# Patient Record
Sex: Female | Born: 2000 | Race: White | Hispanic: No | Marital: Single | Smoking: Never smoker
Health system: Southern US, Community
[De-identification: ages and names within clinical notes are randomized; demographics above are authoritative.]

## PROBLEM LIST (undated history)

## (undated) HISTORY — PX: SPINE SURGERY: SHX786

## (undated) HISTORY — PX: OTHER SURGICAL HISTORY: SHX169

---
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
## 2022-02-03 ENCOUNTER — Ambulatory Visit
Admission: EM | Admit: 2022-02-03 | Discharge: 2022-02-03 | Disposition: A | Discharge status: Home / Self Care | Payer: BC Managed Care – PPO | Attending: Emergency Medicine | Admitting: Emergency Medicine

## 2022-02-03 ENCOUNTER — Encounter: Payer: Self-pay | Admitting: Emergency Medicine

## 2022-02-03 DIAGNOSIS — J029 Acute pharyngitis, unspecified: Secondary | ICD-10-CM | POA: Insufficient documentation

## 2022-02-03 LAB — POCT RAPID STREP A (OFFICE): Rapid Strep A Screen: NEGATIVE

## 2022-02-03 MED ORDER — IBUPROFEN 600 MG PO TABS: 600.0000 mg | ORAL_TABLET | Freq: Four times a day (QID) | ORAL | 0 refills | Status: DC | PRN

## 2022-02-03 MED ORDER — FAMOTIDINE 20 MG PO TABS: 20.0000 mg | ORAL_TABLET | Freq: Two times a day (BID) | ORAL | 0 refills | Status: DC

## 2022-02-03 NOTE — Discharge Instructions (Addendum)
your rapid strep was negative today, so we have sent off a throat culture.  We will contact you and call in the appropriate antibiotics if your culture comes back positive for an infection requiring antibiotic treatment.  Give [place] a working phone number.  1 gram of Tylenol and 600 mg ibuprofen together 3-4 times a day as needed for pain.  Make sure you drink plenty of extra fluids.  Some people find salt water gargles and  Traditional Medicinal's "Throat Coat" tea helpful. Take 5 mL of liquid Benadryl and 5 mL of Maalox. Mix it together, and then hold it in your mouth for as long as you can and then swallow. You may do this 4 times a day.  Also try the Pepcid for a week.  If your sore throat resolves with this, then we know that it was due to acid reflux.  Go to www.goodrx.com  or www.costplusdrugs.com to look up your medications. This will give you a list of where you can find your prescriptions at the most affordable prices. Or ask the pharmacist what the cash price is, or if they have any other discount programs available to help make your medication more affordable. This can be less expensive than what you would pay with insurance.

## 2022-02-03 NOTE — ED Triage Notes (Signed)
Pt presents with ST x 2 days.  °

## 2022-02-03 NOTE — ED Provider Notes (Signed)
HPI  SUBJECTIVE:  Patient reports sore throat starting 2 to 3 days ago. Sx worse with yelling.  Sx better with Advil 400 mg twice daily.  Has also tried Chloraseptic spray without relief. No fever   No neck stiffness  + "Some" cough No nasal congestion, rhinorrhea + Postnasal drip No Myalgias No Headache No Rash  No loss of taste or smell No shortness of breath or difficulty breathing + nausea for day, has since resolved,  No vomiting No diarrhea No abdominal pain     No Recent Strep, mono, COVID exposure She got 2 doses of the COVID-vaccine. + reflux sxs-is reporting "heartburn". No allergy sxs  No Breathing difficulty, voice changes, sensation of throat swelling shut No Drooling No Trismus No abx in past month.  + Antipyretic in the past 6 hours-ibuprofen She is status post spinal fusion.  No history of allergies, GERD LMP: 2 weeks ago.  Denies the possibility of being pregnant. PCP: [facility].   History reviewed. No pertinent past medical history.  Past Surgical History:  Procedure Laterality Date   spinal fusion     July 2016 and November 2016    Family History  Problem Relation Age of Onset   Asthma Brother    Hyperlipidemia Maternal Grandmother    Arthritis Maternal Grandfather    Hyperlipidemia Maternal Grandfather    Arthritis Paternal Grandmother    Hyperlipidemia Paternal Grandmother    Arthritis Paternal Grandfather    Hypertension Paternal Grandfather    Hyperlipidemia Paternal Grandfather     Social History   Tobacco Use   Smoking status: Never   Smokeless tobacco: Never  Vaping Use   Vaping Use: Never used  Substance Use Topics   Alcohol use: Never   Drug use: Never    No current facility-administered medications for this encounter.  Current Outpatient Medications:    famotidine (PEPCID) 20 MG tablet, Take 1 tablet (20 mg total) by mouth 2 (two) times daily for 7 days., Disp: 14 tablet, Rfl: 0   ibuprofen (ADVIL) 600 MG  tablet, Take 1 tablet (600 mg total) by mouth every 6 (six) hours as needed., Disp: 30 tablet, Rfl: 0   cetirizine (ZYRTEC) 10 MG tablet, Take 10 mg by mouth as needed for allergies., Disp: , Rfl:   No Known Allergies   ROS  As noted in HPI.   Physical Exam  BP 116/70 (BP Location: Left Arm)   Pulse 84   Temp 98.3 F (36.8 C) (Oral)   Resp 18   LMP  (LMP Unknown)   SpO2 98%   Constitutional: Well developed, well nourished, no acute distress Eyes:  EOMI, conjunctiva normal bilaterally HENT: Normocephalic, atraumatic,mucus membranes moist.  Nasal congestion.  Normal tonsils without exudates.  Normal oropharynx.  Uvula midline.  Positive postnasal drip, cobblestoning Respiratory: Normal inspiratory effort Cardiovascular: Normal rate, no murmurs, rubs, gallops GI: nondistended, nontender. No appreciable splenomegaly skin: No rash, skin intact Lymph: Positive anterior cervical LN.  No posterior cervical lymphadenopathy Musculoskeletal: no deformities Neurologic: Alert & oriented x 3, no focal neuro deficits Psychiatric: Speech and behavior appropriate.   ED Course   Medications - No data to display  Orders Placed This Encounter  Procedures   Culture, group A strep    Standing Status:   Standing    Number of Occurrences:   1   POCT rapid strep A    Standing Status:   Standing    Number of Occurrences:   1  Results for orders placed or performed during the hospital encounter of 02/03/22 (from the past 24 hour(s))  POCT rapid strep A     Status: None   Collection Time: 02/03/22  9:46 AM  Result Value Ref Range   Rapid Strep A Screen Negative Negative   No results found.  ED Clinical Impression  1. Sore throat      ED Assessment/Plan  Rapid strep negative. Obtaining throat culture to guide antibiotic treatment because of the lymphadenopathy.. Discussed this with patient. We'll contact her if culture is positive, and will call in Appropriate antibiotics. Patient  home with ibuprofen, Tylenol, Benadryl/Maalox mixture, will try a week of Pepcid as she is reporting GERD symptoms. Patient to followup with PCP when necessary.  Discussed labs,  MDM, plan and followup with patient. Discussed sn/sx that should prompt return to the ED. patient agrees with plan.   Meds ordered this encounter  Medications   ibuprofen (ADVIL) 600 MG tablet    Sig: Take 1 tablet (600 mg total) by mouth every 6 (six) hours as needed.    Dispense:  30 tablet    Refill:  0   famotidine (PEPCID) 20 MG tablet    Sig: Take 1 tablet (20 mg total) by mouth 2 (two) times daily for 7 days.    Dispense:  14 tablet    Refill:  0     *This clinic note was created using Scientist, clinical (histocompatibility and immunogenetics). Therefore, there may be occasional mistakes despite careful proofreading.     Domenick Gong, MD 02/03/22 1427

## 2022-02-10 ENCOUNTER — Ambulatory Visit: Payer: BC Managed Care – PPO | Admitting: Internal Medicine

## 2022-02-10 ENCOUNTER — Encounter: Payer: Self-pay | Admitting: Internal Medicine

## 2022-02-10 VITALS — BP 116/72 | HR 71 | Temp 98.4°F | Ht 59.0 in | Wt 132.6 lb

## 2022-02-10 DIAGNOSIS — J028 Acute pharyngitis due to other specified organisms: Secondary | ICD-10-CM | POA: Diagnosis not present

## 2022-02-10 DIAGNOSIS — J029 Acute pharyngitis, unspecified: Secondary | ICD-10-CM | POA: Insufficient documentation

## 2022-02-10 LAB — MONONUCLEOSIS SCREEN: Mono Screen: NEGATIVE

## 2022-02-10 NOTE — Assessment & Plan Note (Addendum)
Symptoms have been unchanged for the past week, unaccompanied by sinus drainage  and accompanied by mild fatigue and persistent cervical LAD.  Negative test for IM/ Mono.  Awaiting results of throat culture.   Empiric azithromycin prescribed

## 2022-02-10 NOTE — Patient Instructions (Signed)
The throat culture done by Urgent Care has not resulted yet  I'm testing you for "Mono" today  I want you to try taking Benadryl at night ONLY to see if it helps (post nasal drip can cause sore throat)

## 2022-02-10 NOTE — Progress Notes (Addendum)
Subjective:  Patient ID: Chelsea Randolph, female    DOB: [birth year]  Age: 21 y.o. MRN: [id number]  CC: The primary encounter diagnosis was Pharyngitis due to other organism. A diagnosis of Pharyngitis, unspecified etiology was also pertinent to this visit.   HPI Chelsea Randolph presents for  Chief Complaint  Patient presents with   Sore Throat    One week history of sore throat ,  started after coming back from the beach.  Was tested last week for strep throat and the POC was negative.  Culture still pending.  Treated for symptomatic GERD which resolved after a few days of pepcid  Throat was sore before the GERD and still sore.   No history of mOno. ,  slight headache.  Some cervical LAD>     Outpatient Medications Prior to Visit  Medication Sig Dispense Refill   cetirizine (ZYRTEC) 10 MG tablet Take 10 mg by mouth as needed for allergies.     famotidine (PEPCID) 20 MG tablet Take 1 tablet (20 mg total) by mouth 2 (two) times daily for 7 days. 14 tablet 0   ibuprofen (ADVIL) 600 MG tablet Take 1 tablet (600 mg total) by mouth every 6 (six) hours as needed. 30 tablet 0   No facility-administered medications prior to visit.    Review of Systems;  Patient denies severe headache, fevers, malaise, unintentional weight loss, skin rash, eye pain, sinus congestion and sinus pain, sore throat, dysphagia,  hemoptysis , cough, dyspnea, wheezing, chest pain, palpitations, orthopnea, edema, abdominal pain, nausea, melena, diarrhea, constipation, flank pain, dysuria, hematuria, urinary  Frequency, nocturia, numbness, tingling, seizures,  Focal weakness, Loss of consciousness,  Tremor, insomnia, depression, anxiety, and suicidal ideation.      Objective:  BP 116/72 (BP Location: Left Arm, Patient Position: Sitting, Cuff Size: Normal)   Pulse 71   Temp 98.4 F (36.9 C) (Oral)   Ht 4\' 11"  (1.499 m)   Wt 132 lb 9.6 oz (60.1 kg)   LMP 01/26/2022   SpO2 99%   BMI 26.78 kg/m   BP  Readings from Last 3 Encounters:  02/10/22 116/72  02/03/22 116/70  03/25/21 (!) 98/52 (17 %, Z = -0.95 /  14 %, Z = -1.08)*   *BP percentiles are based on the 2017 AAP Clinical Practice Guideline for girls    Wt Readings from Last 3 Encounters:  02/10/22 132 lb 9.6 oz (60.1 kg) (63 %, Z= 0.32)*  03/25/21 128 lb (58.1 kg) (59 %, Z= 0.22)*   * Growth percentiles are based on CDC (Girls, 2-20 Years) data.    General appearance: alert, cooperative and appears stated age Ears: TMs occluded by Cerumen s Throat: lips, mucosa, and tongue normal; teeth and gums normal. Tonsillar erythema bilaterally without ulceration Neck: tender cervical LAD, no carotid bruit, supple, symmetrical, trachea midline and thyroid not enlarged, symmetric, no tenderness/mass/nodules Back: symmetric, no curvature. ROM normal. No CVA tenderness. Lungs: clear to auscultation bilaterally Heart: regular rate and rhythm, S1, S2 normal, no murmur, click, rub or gallop Abdomen: soft, non-tender; bowel sounds normal; no masses,  no organomegaly Pulses: 2+ and symmetric Skin: Skin color, texture, turgor normal. No rashes or lesions Lymph nodes: Cervical, supraclavicular, and axillary nodes normal.  No results found for: "HGBA1C"  No results found for: "CREATININE"  No results found for: "WBC", "HGB", "HCT", "PLT", "GLUCOSE", "CHOL", "TRIG", "HDL", "LDLDIRECT", "LDLCALC", "ALT", "AST", "NA", "K", "CL", "CREATININE", "BUN", "CO2", "TSH", "PSA", "INR", "GLUF", "HGBA1C", "MICROALBUR"  No results found.  Assessment & Plan:   Problem List Items Addressed This Visit     Pharyngitis - Primary    Symptoms have been unchanged for the past week, unaccompanied by sinus drainage  and accompanied by mild fatigue and persistent cervical LAD.  Negative test for IM/ Mono.  Awaiting results of throat culture.   Empiric azithromycin prescribed       Relevant Orders   Monospot (Completed)    Follow-up: No follow-ups on  file.   Crecencio Mc, MD

## 2022-02-11 MED ORDER — AZITHROMYCIN 500 MG PO TABS: 500.0000 mg | ORAL_TABLET | Freq: Every day | ORAL | 0 refills | Status: DC

## 2022-02-11 NOTE — Addendum Note (Signed)
Addended by: Sherlene Shams on: 02/11/2022 02:31 PM   Modules accepted: Orders

## 2022-09-07 ENCOUNTER — Encounter: Payer: Self-pay | Admitting: Internal Medicine

## 2022-09-07 ENCOUNTER — Ambulatory Visit: Payer: 59 | Admitting: Internal Medicine

## 2022-09-07 ENCOUNTER — Other Ambulatory Visit: Payer: Self-pay | Admitting: Internal Medicine

## 2022-09-07 VITALS — BP 122/64 | HR 81 | Temp 98.1°F | Ht 59.0 in | Wt 137.0 lb

## 2022-09-07 DIAGNOSIS — N926 Irregular menstruation, unspecified: Secondary | ICD-10-CM | POA: Diagnosis not present

## 2022-09-07 LAB — POCT PREGNANCY, URINE

## 2022-09-07 MED ORDER — SLYND 4 MG PO TABS
1.0000 | ORAL_TABLET | Freq: Every day | ORAL | 5 refills | Status: DC
Start: 2022-09-07 — End: 2022-09-09

## 2022-09-07 NOTE — Progress Notes (Signed)
Subjective:  Patient ID: Chelsea Randolph, female    DOB: 26-Jul-2001  Age: 22 y.o. MRN: [id number]  CC: The encounter diagnosis was Menstrual irregularity.   HPI Chelsea Randolph presents for MANAGEMENT OF menstrual irregularity Chief Complaint  Patient presents with   Menstrual Problem   Chelsea Randolph is a 22 yr old female with  history of juvenile idiopathic scoliosis s/p spinal fusion in 2016 who resents with menstrual irregularity . She is on break at [employer]  as a freshman   Age of menarche: at age 57 . Periods have been occurring regularly and lasting up to 5 days until the last several months.   She has had a weight gain of 9 lbs since July 2022  Periods have become irregular  since starting  college  she skipped 2 months,   then had  period in dearly December  that lasted 3 weeks . Still spotting   Not sexually active.  Wants a progesterone only pill    Outpatient Medications Prior to Visit  Medication Sig Dispense Refill   azithromycin (ZITHROMAX) 500 MG tablet Take 1 tablet (500 mg total) by mouth daily. (Patient not taking: Reported on 09/07/2022) 7 tablet 0   cetirizine (ZYRTEC) 10 MG tablet Take 10 mg by mouth as needed for allergies.     famotidine (PEPCID) 20 MG tablet Take 1 tablet (20 mg total) by mouth 2 (two) times daily for 7 days. 14 tablet 0   ibuprofen (ADVIL) 600 MG tablet Take 1 tablet (600 mg total) by mouth every 6 (six) hours as needed. 30 tablet 0   No facility-administered medications prior to visit.    Review of Systems;  Patient denies headache, fevers, malaise, unintentional weight loss, skin rash, eye pain, sinus congestion and sinus pain, sore throat, dysphagia,  hemoptysis , cough, dyspnea, wheezing, chest pain, palpitations, orthopnea, edema, abdominal pain, nausea, melena, diarrhea, constipation, flank pain, dysuria, hematuria, urinary  Frequency, nocturia, numbness, tingling, seizures,  Focal weakness, Loss of consciousness,  Tremor, insomnia,  depression, anxiety, and suicidal ideation.      Objective:  BP 122/64   Pulse 81   Temp 98.1 F (36.7 C) (Oral)   Ht 4\' 11"  (1.499 m)   Wt 137 lb (62.1 kg)   SpO2 98%   BMI 27.67 kg/m   BP Readings from Last 3 Encounters:  09/07/22 122/64  02/10/22 116/72  02/03/22 116/70    Wt Readings from Last 3 Encounters:  09/07/22 137 lb (62.1 kg) (67 %, Z= 0.44)*  02/10/22 132 lb 9.6 oz (60.1 kg) (63 %, Z= 0.32)*  03/25/21 128 lb (58.1 kg) (59 %, Z= 0.22)*   * Growth percentiles are based on CDC (Girls, 2-20 Years) data.    Physical Exam Vitals reviewed.  Constitutional:      General: She is not in acute distress.    Appearance: Normal appearance. She is normal weight. She is not ill-appearing, toxic-appearing or diaphoretic.  HENT:     Head: Normocephalic.  Eyes:     General: No scleral icterus.       Right eye: No discharge.        Left eye: No discharge.     Conjunctiva/sclera: Conjunctivae normal.  Cardiovascular:     Rate and Rhythm: Normal rate and regular rhythm.     Heart sounds: Normal heart sounds.  Pulmonary:     Effort: Pulmonary effort is normal. No respiratory distress.     Breath sounds: Normal breath sounds.  Musculoskeletal:  General: Normal range of motion.  Skin:    General: Skin is warm and dry.  Neurological:     General: No focal deficit present.     Mental Status: She is alert and oriented to person, place, and time. Mental status is at baseline.  Psychiatric:        Mood and Affect: Mood normal.        Behavior: Behavior normal.        Thought Content: Thought content normal.        Judgment: Judgment normal.     No results found for: "HGBA1C"  No results found for: "CREATININE" No results found.  Assessment & Plan:  .Menstrual irregularity Assessment & Plan: Pregnancy ruled out.  Thyroid function, CBC and liver enzymes are  pending .  Starting progesterone only pills   Orders: -     POCT Pregnancy, Urine -     TSH -      Comprehensive metabolic panel  Other orders -     Slynd; Take 1 tablet by mouth daily.  Dispense: 28 tablet; Refill: 5     Follow-up: No follow-ups on file.   Crecencio Mc, MD

## 2022-09-07 NOTE — Assessment & Plan Note (Addendum)
Pregnancy ruled out.  Thyroid function, CBC and liver enzymes are  pending .  Starting progesterone only pills

## 2022-09-08 LAB — TSH: TSH: 3.06 u[IU]/mL (ref 0.40–5.00)

## 2022-09-08 LAB — COMPREHENSIVE METABOLIC PANEL
ALT: 14 U/L (ref 0–35)
AST: 17 U/L (ref 0–37)
Albumin: 4.9 g/dL (ref 3.5–5.2)
Alkaline Phosphatase: 39 U/L — ABNORMAL LOW (ref 47–119)
BUN: 18 mg/dL (ref 6–23)
CO2: 25 mEq/L (ref 19–32)
Calcium: 9.7 mg/dL (ref 8.4–10.5)
Chloride: 102 mEq/L (ref 96–112)
Creatinine, Ser: 0.78 mg/dL (ref 0.40–1.20)
GFR: 110.23 mL/min (ref >60.00)
Glucose, Bld: 87 mg/dL (ref 70–99)
Potassium: 3.9 mEq/L (ref 3.5–5.1)
Sodium: 138 mEq/L (ref 135–145)
Total Bilirubin: 0.4 mg/dL (ref 0.2–1.2)
Total Protein: 7.5 g/dL (ref 6.0–8.3)

## 2022-09-09 NOTE — Telephone Encounter (Signed)
Pharmacy sent back stating that they need an alternative one sent in is not covered by insurance.

## 2023-03-02 ENCOUNTER — Encounter: Payer: Self-pay | Admitting: Internal Medicine

## 2023-03-02 ENCOUNTER — Ambulatory Visit: Payer: 59 | Admitting: Internal Medicine

## 2023-03-02 VITALS — BP 104/64 | HR 85 | Temp 98.4°F | Ht 59.0 in | Wt 137.2 lb

## 2023-03-02 DIAGNOSIS — J029 Acute pharyngitis, unspecified: Secondary | ICD-10-CM

## 2023-03-02 DIAGNOSIS — R7401 Elevation of levels of liver transaminase levels: Secondary | ICD-10-CM

## 2023-03-02 DIAGNOSIS — Z79899 Other long term (current) drug therapy: Secondary | ICD-10-CM

## 2023-03-02 DIAGNOSIS — R41840 Attention and concentration deficit: Secondary | ICD-10-CM

## 2023-03-02 DIAGNOSIS — D72820 Lymphocytosis (symptomatic): Secondary | ICD-10-CM | POA: Diagnosis not present

## 2023-03-02 DIAGNOSIS — D649 Anemia, unspecified: Secondary | ICD-10-CM

## 2023-03-02 NOTE — Patient Instructions (Signed)
  Referral to [facility] in [place] for ADD testing.  If you can't get in before you return to [place],  I will prescribe  Blase Mess as a trial    I recommend taking Allegra (fexofenadine) 180  mg daily for 4 weeks to see if the throat and lymph nodes clear up

## 2023-03-02 NOTE — Progress Notes (Unsigned)
Subjective:  Patient ID: Chelsea Randolph, female    DOB: Jun 20, 2001  Age: 22 y.o. MRN: [id number]  CC: The primary encounter diagnosis was Long-term use of high-risk medication. Diagnoses of Pharyngitis, unspecified etiology and Disturbed concentration were also pertinent to this visit.   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  She barely passed her classes,but is "not on academic probation."  She states that she would get to class and wold become distracted by her own thoughts after 5 minutes and would lose up to 30 minutes of lecture due to not paying attention.  She does not recall having any trouble in high school,  and she was able to finish all of her assignments on time.  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  After taking several Business classes  she has realized that she prefers to write ratherthan take tests/  tuesnd herself i unusual for heryear was intellectually challenging      Recently treated with amoxicillin for persistent pharyngitis after developing drainage,  congestion and sore throat.  No fevers,  no headaches. Given prednisone and amoxicillin at first UC visit.  She  was seen by a different UC a week later for failure to improve  , and throat culture done , which was reportes as "positive for bacteria" and advised to continue amoxicillin  However on day 8 she devloped a skin rash so she stopped the medication .Marland Kitchen  Ears felt full initially.  Some sinus drainage. .  .         Outpatient Medications Prior to Visit  Medication Sig Dispense Refill   norethindrone (ORTHO MICRONOR) 0.35 MG tablet Take 1 tablet (0.35 mg total) by mouth daily. 28 tablet 11   No  facility-administered medications prior to visit.    Review of Systems;  Patient denies headache, fevers, malaise, unintentional weight loss, skin rash, eye pain, sinus congestion and sinus pain, sore throat, dysphagia,  hemoptysis , cough, dyspnea, wheezing, chest pain, palpitations, orthopnea, edema, abdominal pain, nausea, melena, diarrhea, constipation, flank pain, dysuria, hematuria, urinary  Frequency, nocturia, numbness, tingling, seizures,  Focal weakness, Loss of consciousness,  Tremor, insomnia, depression, anxiety, and suicidal ideation.      Objective:  BP 104/64   Pulse 85   Temp 98.4 F (36.9 C) (Oral)   Ht 4\' 11"  (1.499 m)   Wt 137 lb 3.2 oz (62.2 kg)   SpO2 98%   BMI 27.71 kg/m   BP Readings from Last 3 Encounters:  03/02/23 104/64  09/07/22 122/64  02/10/22 116/72    Wt Readings from Last 3 Encounters:  03/02/23 137 lb 3.2 oz (62.2 kg) (65 %, Z= 0.40)*  09/07/22 137 lb (62.1 kg) (67 %, Z= 0.44)*  02/10/22 132 lb 9.6 oz (60.1 kg) (63 %, Z= 0.32)*   * Growth percentiles are based on CDC (Girls, 2-20 Years) data.    Physical Exam Vitals reviewed.  Constitutional:      General: She is not in acute distress.    Appearance: Normal appearance. She is normal weight. She is not ill-appearing, toxic-appearing or diaphoretic.  HENT:     Head: Normocephalic.  Eyes:  General: No scleral icterus.       Right eye: No discharge.        Left eye: No discharge.     Conjunctiva/sclera: Conjunctivae normal.  Cardiovascular:     Rate and Rhythm: Normal rate and regular rhythm.     Heart sounds: Normal heart sounds.  Pulmonary:     Effort: Pulmonary effort is normal. No respiratory distress.     Breath sounds: Normal breath sounds.  Musculoskeletal:        General: Normal range of motion.  Skin:    General: Skin is warm and dry.  Neurological:     General: No focal deficit present.     Mental Status: She is alert and oriented to person, place, and time. Mental  status is at baseline.  Psychiatric:        Mood and Affect: Mood normal.        Behavior: Behavior normal.        Thought Content: Thought content normal.        Judgment: Judgment normal.    No results found for: "HGBA1C"  Lab Results  Component Value Date   CREATININE 0.80 03/02/2023   CREATININE 0.78 09/07/2022    Lab Results  Component Value Date   WBC 5.5 03/02/2023   HGB 11.8 (L) 03/02/2023   HCT 35.4 (L) 03/02/2023   PLT 300.0 03/02/2023   GLUCOSE 84 03/02/2023   ALT 48 (H) 03/02/2023   AST 25 03/02/2023   NA 141 03/02/2023   K 4.3 03/02/2023   CL 105 03/02/2023   CREATININE 0.80 03/02/2023   BUN 14 03/02/2023   CO2 26 03/02/2023   TSH 3.06 09/07/2022    No results found.  Assessment & Plan:  .Long-term use of high-risk medication -     Comprehensive metabolic panel  Pharyngitis, unspecified etiology Assessment & Plan: Exam is benign ,. the urgent care provider told her she had a "positive bacterial culture" which she partially treated before stopping amoxicillin on day 8 .  Recommend treating allergies at this point   Orders: -     CBC with Differential/Platelet  Disturbed concentration Assessment & Plan: Unclear if symptoms are due to transition from high school to college,  romantic conflict  or ADD.  Referring for testing  Orders: -     Ambulatory referral to Psychology     I provided 30 minutes of face-to-face time during this encounter reviewing patient's last visit with me,  previous  labs and imaging studies, counseling on time management and study habits for college classes ,   and post visit ordering to diagnostics and therapeutics .   Follow-up: No follow-ups on file.   Sherlene Shams, MD

## 2023-03-03 DIAGNOSIS — R41840 Attention and concentration deficit: Secondary | ICD-10-CM | POA: Insufficient documentation

## 2023-03-03 LAB — COMPREHENSIVE METABOLIC PANEL
ALT: 48 U/L — ABNORMAL HIGH (ref 0–35)
AST: 25 U/L (ref 0–37)
Albumin: 4.5 g/dL (ref 3.5–5.2)
Alkaline Phosphatase: 41 U/L — ABNORMAL LOW (ref 47–119)
BUN: 14 mg/dL (ref 6–23)
CO2: 26 mEq/L (ref 19–32)
Calcium: 9.6 mg/dL (ref 8.4–10.5)
Chloride: 105 mEq/L (ref 96–112)
Creatinine, Ser: 0.8 mg/dL (ref 0.40–1.20)
GFR: 106.57 mL/min (ref >60.00)
Glucose, Bld: 84 mg/dL (ref 70–99)
Potassium: 4.3 mEq/L (ref 3.5–5.1)
Sodium: 141 mEq/L (ref 135–145)
Total Bilirubin: 0.7 mg/dL (ref 0.2–1.2)
Total Protein: 7.5 g/dL (ref 6.0–8.3)

## 2023-03-03 LAB — CBC WITH DIFFERENTIAL/PLATELET
Basophils Absolute: 0.1 10*3/uL (ref 0.0–0.1)
Basophils Relative: 1.5 % (ref 0.0–3.0)
Eosinophils Absolute: 0.2 10*3/uL (ref 0.0–0.7)
Eosinophils Relative: 2.8 % (ref 0.0–5.0)
HCT: 35.4 % — ABNORMAL LOW (ref 36.0–49.0)
Hemoglobin: 11.8 g/dL — ABNORMAL LOW (ref 12.0–16.0)
Lymphocytes Relative: 49.3 % — ABNORMAL HIGH (ref 24.0–48.0)
Lymphs Abs: 2.7 10*3/uL (ref 0.7–4.0)
MCHC: 33.3 g/dL (ref 31.0–37.0)
MCV: 87 fl (ref 78.0–98.0)
Monocytes Absolute: 0.5 10*3/uL (ref 0.1–1.0)
Monocytes Relative: 8.2 % (ref 3.0–12.0)
Neutro Abs: 2.1 10*3/uL (ref 1.4–7.7)
Neutrophils Relative %: 38.2 % — ABNORMAL LOW (ref 43.0–71.0)
Platelets: 300 10*3/uL (ref 150.0–575.0)
RBC: 4.07 Mil/uL (ref 3.80–5.70)
RDW: 14 % (ref 11.4–15.5)
WBC: 5.5 10*3/uL (ref 4.5–13.5)

## 2023-03-03 NOTE — Addendum Note (Signed)
Addended by: Sherlene Shams on: 03/03/2023 10:59 PM   Modules accepted: Orders

## 2023-03-03 NOTE — Assessment & Plan Note (Signed)
Unclear if symptoms are due to transition from high school to college,  romantic conflict  or ADD.  Referring for testing

## 2023-03-03 NOTE — Assessment & Plan Note (Signed)
Exam is benign ,. the urgent care provider told her she had a "positive bacterial culture" which she partially treated before stopping amoxicillin on day 8 .  Recommend treating allergies at this point

## 2023-03-04 ENCOUNTER — Encounter: Payer: Self-pay | Admitting: Internal Medicine

## 2023-03-16 ENCOUNTER — Other Ambulatory Visit (INDEPENDENT_AMBULATORY_CARE_PROVIDER_SITE_OTHER): Payer: 59

## 2023-03-16 DIAGNOSIS — D649 Anemia, unspecified: Secondary | ICD-10-CM

## 2023-03-16 DIAGNOSIS — R7401 Elevation of levels of liver transaminase levels: Secondary | ICD-10-CM | POA: Diagnosis not present

## 2023-03-16 LAB — HEPATIC FUNCTION PANEL
ALT: 20 U/L (ref 0–35)
AST: 17 U/L (ref 0–37)
Albumin: 4.5 g/dL (ref 3.5–5.2)
Alkaline Phosphatase: 39 U/L — ABNORMAL LOW (ref 47–119)
Bilirubin, Direct: 0.1 mg/dL (ref 0.0–0.3)
Total Bilirubin: 0.4 mg/dL (ref 0.2–1.2)
Total Protein: 7.2 g/dL (ref 6.0–8.3)

## 2023-03-16 LAB — CBC WITH DIFFERENTIAL/PLATELET
Basophils Absolute: 0 10*3/uL (ref 0.0–0.1)
Basophils Relative: 0.8 % (ref 0.0–3.0)
Eosinophils Absolute: 0.2 10*3/uL (ref 0.0–0.7)
Eosinophils Relative: 4.5 % (ref 0.0–5.0)
HCT: 37.4 % (ref 36.0–49.0)
Hemoglobin: 12.4 g/dL (ref 12.0–16.0)
Lymphocytes Relative: 41.8 % (ref 24.0–48.0)
Lymphs Abs: 2.2 10*3/uL (ref 0.7–4.0)
MCHC: 33.1 g/dL (ref 31.0–37.0)
MCV: 86.2 fl (ref 78.0–98.0)
Monocytes Absolute: 0.5 10*3/uL (ref 0.1–1.0)
Monocytes Relative: 8.6 % (ref 3.0–12.0)
Neutro Abs: 2.3 10*3/uL (ref 1.4–7.7)
Neutrophils Relative %: 44.3 % (ref 43.0–71.0)
Platelets: 215 10*3/uL (ref 150.0–575.0)
RBC: 4.34 Mil/uL (ref 3.80–5.70)
RDW: 13.2 % (ref 11.4–15.5)
WBC: 5.3 10*3/uL (ref 4.5–13.5)

## 2023-03-16 LAB — B12 AND FOLATE PANEL
Folate: 7.5 ng/mL (ref >5.9)
Vitamin B-12: 451 pg/mL (ref 211–911)

## 2023-03-17 LAB — HEPATITIS B SURFACE ANTIBODY,QUALITATIVE: Hep B S Ab: NONREACTIVE

## 2023-03-17 LAB — IRON,TIBC AND FERRITIN PANEL
%SAT: 14 % (calc) — ABNORMAL LOW (ref 15–45)
Ferritin: 15 ng/mL — ABNORMAL LOW (ref 16–154)
Iron: 51 ug/dL (ref 27–164)
TIBC: 363 mcg/dL (calc) (ref 271–448)

## 2023-03-17 LAB — HEPATITIS B SURFACE ANTIGEN: Hepatitis B Surface Ag: NONREACTIVE

## 2023-03-17 LAB — HEPATITIS B CORE ANTIBODY, TOTAL: Hep B Core Total Ab: NONREACTIVE

## 2023-03-17 LAB — HEPATITIS C ANTIBODY: Hepatitis C Ab: NONREACTIVE

## 2023-07-08 ENCOUNTER — Other Ambulatory Visit: Payer: Self-pay | Admitting: Internal Medicine

## 2023-07-25 ENCOUNTER — Ambulatory Visit (INDEPENDENT_AMBULATORY_CARE_PROVIDER_SITE_OTHER): Payer: 59 | Admitting: Internal Medicine

## 2023-07-25 ENCOUNTER — Other Ambulatory Visit (HOSPITAL_COMMUNITY)
Admission: RE | Admit: 2023-07-25 | Discharge: 2023-07-25 | Disposition: A | Discharge status: Home / Self Care | Payer: 59 | Source: Ambulatory Visit | Attending: Internal Medicine | Admitting: Internal Medicine

## 2023-07-25 ENCOUNTER — Encounter: Payer: Self-pay | Admitting: Internal Medicine

## 2023-07-25 VITALS — BP 120/62 | HR 97 | Ht 59.0 in | Wt 135.0 lb

## 2023-07-25 DIAGNOSIS — Z124 Encounter for screening for malignant neoplasm of cervix: Secondary | ICD-10-CM | POA: Diagnosis not present

## 2023-07-25 DIAGNOSIS — Z79899 Other long term (current) drug therapy: Secondary | ICD-10-CM

## 2023-07-25 DIAGNOSIS — Z0000 Encounter for general adult medical examination without abnormal findings: Secondary | ICD-10-CM

## 2023-07-25 DIAGNOSIS — N898 Other specified noninflammatory disorders of vagina: Secondary | ICD-10-CM

## 2023-07-25 DIAGNOSIS — N926 Irregular menstruation, unspecified: Secondary | ICD-10-CM

## 2023-07-25 DIAGNOSIS — Z113 Encounter for screening for infections with a predominantly sexual mode of transmission: Secondary | ICD-10-CM | POA: Insufficient documentation

## 2023-07-25 NOTE — Assessment & Plan Note (Signed)
Pelvic exam  for STD without PAP smear or HPV was done today .  Will start PAP smear at age 22

## 2023-07-25 NOTE — Patient Instructions (Signed)
Your STD screens should be complete by Friday  The guidelines have changed !  We start cervical screening NEXT YEAR

## 2023-07-26 LAB — COMPREHENSIVE METABOLIC PANEL
ALT: 15 U/L (ref 0–35)
AST: 16 U/L (ref 0–37)
Albumin: 4.9 g/dL (ref 3.5–5.2)
Alkaline Phosphatase: 32 U/L — ABNORMAL LOW (ref 39–117)
BUN: 14 mg/dL (ref 6–23)
CO2: 28 meq/L (ref 19–32)
Calcium: 9.9 mg/dL (ref 8.4–10.5)
Chloride: 102 meq/L (ref 96–112)
Creatinine, Ser: 0.9 mg/dL (ref 0.40–1.20)
GFR: 92.27 mL/min (ref >60.00)
Glucose, Bld: 98 mg/dL (ref 70–99)
Potassium: 4.3 meq/L (ref 3.5–5.1)
Sodium: 139 meq/L (ref 135–145)
Total Bilirubin: 0.6 mg/dL (ref 0.2–1.2)
Total Protein: 7.5 g/dL (ref 6.0–8.3)

## 2023-07-26 NOTE — Assessment & Plan Note (Addendum)
age appropriate education and counseling updated, (PAP smear for cervical ca screening not done as screening now begins at age 20 per ACOG guidelines)  referrals for preventative services and immunizations addressed, dietary and smoking counseling addressed, most recent labs reviewed.  She has not had the HPV vaccination.  Series was offered today but she has deferred   I have personally reviewed and have noted:   1) the patient's medical and social history 2) The pt's use of alcohol, tobacco, and illicit drugs 3) The patient's current medications and supplements 4) Functional ability including ADL's, fall risk, home safety risk, hearing and visual impairment 5) Diet and physical activities 6) Evidence for depression or mood disorder 7) The patient's height, weight, and BMI have been recorded in the chart   I have made referrals, and provided counseling and education based on review of the above

## 2023-07-26 NOTE — Progress Notes (Signed)
Patient ID: Chelsea Randolph, female    DOB: Apr 27, 2001  Age: 22 y.o. MRN: [id number]  The patient is here for annual preventive examination and management of other chronic and acute problems.   The risk factors are reflected in the social history.   The roster of all physicians providing medical care to patient - is listed in the Snapshot section of the chart.   Activities of daily living:  The patient is 100% independent in all ADLs: dressing, toileting, feeding as well as independent mobility   Home safety : The patient has smoke detectors in the home. They wear seatbelts.  There are no unsecured firearms at home. There is no violence in the home.    There is no risks for hepatitis, STDs or HIV. There is no   history of blood transfusion. They have no travel history to infectious disease endemic areas of the world.   The patient has seen their dentist in the last six month. They have seen their eye doctor in the last year. The patinet  denies slight hearing difficulty with regard to whispered voices and some television programs.  They have deferred audiologic testing in the last year.  They do not  have excessive sun exposure. Discussed the need for sun protection: hats, long sleeves and use of sunscreen if there is significant sun exposure.    Diet: the importance of a healthy diet is discussed. They do have a healthy diet.   The benefits of regular aerobic exercise were discussed. The patient  exercises  3 to 5 days per week  for  60 minutes.    Depression screen: there are no signs or vegative symptoms of depression- irritability, change in appetite, anhedonia, sadness/tearfullness.   The following portions of the patient's history were reviewed and updated as appropriate: allergies, current medications, past family history, past medical history,  past surgical history, past social history  and problem list.   Visual acuity was not assessed per patient preference since the patient has  regular follow up with an  ophthalmologist. Hearing and body mass index were assessed and reviewed.    During the course of the visit the patient was educated and counseled about appropriate screening and preventive services including : fall prevention , diabetes screening, nutrition counseling, colorectal cancer screening, and recommended immunizations.    Chief Complaint:  Vaginal discharge has become somewhat thicker than usual .  Denies pelvic pain.  On OCPS and does not have periods.  Sexually active but not in 2 months. Uses barrier protection most of the tie.    Review of Symptoms  Patient denies headache, fevers, malaise, unintentional weight loss, skin rash, eye pain, sinus congestion and sinus pain, sore throat, dysphagia,  hemoptysis , cough, dyspnea, wheezing, chest pain, palpitations, orthopnea, edema, abdominal pain, nausea, melena, diarrhea, constipation, flank pain, dysuria, hematuria, urinary  Frequency, nocturia, numbness, tingling, seizures,  Focal weakness, Loss of consciousness,  Tremor, insomnia, depression, anxiety, and suicidal ideation.    Physical Exam:  BP 120/62   Pulse 97   Ht 4\' 11"  (1.499 m)   Wt 135 lb (61.2 kg)   SpO2 99%   BMI 27.27 kg/m    Physical Exam Vitals reviewed.  Constitutional:      General: She is not in acute distress.    Appearance: Normal appearance. She is well-developed and normal weight. She is not ill-appearing, toxic-appearing or diaphoretic.  HENT:     Head: Normocephalic.     Right Ear: Tympanic membrane,  ear canal and external ear normal. There is no impacted cerumen.     Left Ear: Tympanic membrane, ear canal and external ear normal. There is no impacted cerumen.     Nose: Nose normal.     Mouth/Throat:     Mouth: Mucous membranes are moist.     Pharynx: Oropharynx is clear.  Eyes:     General: No scleral icterus.       Right eye: No discharge.        Left eye: No discharge.     Conjunctiva/sclera: Conjunctivae normal.      Pupils: Pupils are equal, round, and reactive to light.  Neck:     Thyroid: No thyromegaly.     Vascular: No carotid bruit or JVD.  Cardiovascular:     Rate and Rhythm: Normal rate and regular rhythm.     Heart sounds: Normal heart sounds.  Pulmonary:     Effort: Pulmonary effort is normal. No respiratory distress.     Breath sounds: Normal breath sounds.  Chest:  Breasts:    Breasts are symmetrical.     Right: Normal. No swelling, inverted nipple, mass, nipple discharge, skin change or tenderness.     Left: Normal. No swelling, inverted nipple, mass, nipple discharge, skin change or tenderness.  Abdominal:     General: Bowel sounds are normal.     Palpations: Abdomen is soft. There is no mass.     Tenderness: There is no abdominal tenderness. There is no guarding or rebound.     Hernia: There is no hernia in the left inguinal area or right inguinal area.  Genitourinary:    General: Normal vulva.     Exam position: Lithotomy position.     Pubic Area: No rash or pubic lice.      Labia:        Right: No rash, tenderness, lesion or injury.        Left: Rash present. No tenderness, lesion or injury.      Vagina: Normal.     Cervix: Normal and dilated.     Uterus: Normal.      Adnexa: Right adnexa normal and left adnexa normal.  Musculoskeletal:        General: Normal range of motion.     Cervical back: Normal range of motion and neck supple.  Lymphadenopathy:     Cervical: No cervical adenopathy.     Upper Body:     Right upper body: No supraclavicular, axillary or pectoral adenopathy.     Left upper body: No supraclavicular, axillary or pectoral adenopathy.     Lower Body: No right inguinal adenopathy. No left inguinal adenopathy.  Skin:    General: Skin is warm and dry.  Neurological:     General: No focal deficit present.     Mental Status: She is alert and oriented to person, place, and time. Mental status is at baseline.  Psychiatric:        Mood and Affect: Mood  normal.        Behavior: Behavior normal.        Thought Content: Thought content normal.        Judgment: Judgment normal.    Assessment and Plan: Cervical cancer screening -     Cytology - PAP  Vaginal discharge -     Cervicovaginal ancillary only  Long-term use of high-risk medication -     Comprehensive metabolic panel  Screen for STD (sexually transmitted disease) Assessment & Plan: Pelvic exam  for STD without PAP smear or HPV was done today .  Will start PAP smear at age 5    Menstrual irregularity Assessment & Plan: Periods are now light or missed.  Continue progesterone only pills    Encounter for preventive health examination Assessment & Plan: age appropriate education and counseling updated, (PAP smear for cervical ca screening not done as screening now begins at age 58 per ACOG guidelines)  referrals for preventative services and immunizations addressed, dietary and smoking counseling addressed, most recent labs reviewed.  She has not had the HPV vaccination.  Series was offered today but she has deferred   I have personally reviewed and have noted:   1) the patient's medical and social history 2) The pt's use of alcohol, tobacco, and illicit drugs 3) The patient's current medications and supplements 4) Functional ability including ADL's, fall risk, home safety risk, hearing and visual impairment 5) Diet and physical activities 6) Evidence for depression or mood disorder 7) The patient's height, weight, and BMI have been recorded in the chart   I have made referrals, and provided counseling and education based on review of the above      Return in about 1 year (around 07/24/2024).  Sherlene Shams, MD

## 2023-07-26 NOTE — Assessment & Plan Note (Addendum)
Periods are now light or missed.  Continue progesterone only pills

## 2023-07-27 LAB — CERVICOVAGINAL ANCILLARY ONLY
Bacterial Vaginitis (gardnerella): NEGATIVE
Candida Glabrata: NEGATIVE
Candida Vaginitis: NEGATIVE
Comment: NEGATIVE
Comment: NEGATIVE
Comment: NEGATIVE

## 2023-07-28 LAB — CYTOLOGY - PAP
Chlamydia: NEGATIVE
Comment: NEGATIVE
Comment: NEGATIVE
Comment: NEGATIVE
Comment: NORMAL
Diagnosis: NEGATIVE
HSV1: NEGATIVE
HSV2: NEGATIVE
Neisseria Gonorrhea: NEGATIVE
Trichomonas: NEGATIVE

## 2023-09-26 ENCOUNTER — Ambulatory Visit
Admission: RE | Admit: 2023-09-26 | Discharge: 2023-09-26 | Disposition: A | Discharge status: Home / Self Care | Payer: 59 | Source: Ambulatory Visit | Attending: Emergency Medicine | Admitting: Emergency Medicine

## 2023-09-26 VITALS — BP 116/72 | HR 73 | Temp 98.9°F | Resp 18

## 2023-09-26 DIAGNOSIS — J069 Acute upper respiratory infection, unspecified: Secondary | ICD-10-CM | POA: Diagnosis not present

## 2023-09-26 LAB — POC COVID19/FLU A&B COMBO
Covid Antigen, POC: NEGATIVE
Influenza A Antigen, POC: NEGATIVE
Influenza B Antigen, POC: NEGATIVE

## 2023-09-26 LAB — POCT RAPID STREP A (OFFICE): Rapid Strep A Screen: NEGATIVE

## 2023-09-26 MED ORDER — BENZONATATE 100 MG PO CAPS: 100.0000 mg | ORAL_CAPSULE | Freq: Three times a day (TID) | ORAL | 0 refills | Status: DC | PRN

## 2023-09-26 NOTE — ED Triage Notes (Signed)
Patient to Urgent Care with complaints of sore throat/ cough/ fatigue. Possible low grade fever on Saturday.  Reports symptoms started two days ago.   Taking Dayquil/ ibuprofen.

## 2023-09-26 NOTE — Discharge Instructions (Signed)
 The strep, COVID and flu tests are negative.   Take the [facility] as directed for cough.  Take Tylenol or ibuprofen as needed for fever or discomfort.  Take plain Mucinex as needed for congestion.  Rest and keep yourself hydrated.    Follow-up with your primary care provider if your symptoms are not improving.

## 2023-09-26 NOTE — ED Provider Notes (Signed)
Chelsea Randolph    CSN: [id number] Arrival date & time: 09/26/23  1348      History   Chief Complaint Chief Complaint  Patient presents with   Cough    Entered by patient    HPI Chelsea Randolph is a 23 y.o. female.  Patient presents with 2-day history of congestion, sore throat, cough, fatigue.  She reports subjective fever on the first day of her symptoms.  Treating symptoms with DayQuil and ibuprofen.  No shortness of breath, vomiting, diarrhea.  The history is provided by the patient and medical records.    History reviewed. No pertinent past medical history.  Patient Active Problem List   Diagnosis Date Noted   Screen for STD (sexually transmitted disease) 07/25/2023   Disturbed concentration 03/03/2023   Menstrual irregularity 09/07/2022   Encounter for preventive health examination 03/28/2021    Past Surgical History:  Procedure Laterality Date   spinal fusion     July 2016 and November 2016    OB History   No obstetric history on file.      Home Medications    Prior to Admission medications   Medication Sig Start Date End Date Taking? Authorizing Provider  benzonatate (TESSALON) 100 MG capsule Take 1 capsule (100 mg total) by mouth 3 (three) times daily as needed for cough. 09/26/23  Yes Mickie Bail, NP  HEATHER 0.35 MG tablet TAKE 1 TABLET BY MOUTH EVERY DAY 07/08/23   Sherlene Shams, MD    Family History Family History  Problem Relation Age of Onset   Asthma Brother    Hyperlipidemia Maternal Grandmother    Arthritis Maternal Grandfather    Hyperlipidemia Maternal Grandfather    Arthritis Paternal Grandmother    Hyperlipidemia Paternal Grandmother    Arthritis Paternal Grandfather    Hypertension Paternal Grandfather    Hyperlipidemia Paternal Grandfather     Social History Social History   Tobacco Use   Smoking status: Never   Smokeless tobacco: Never  Vaping Use   Vaping status: Never Used  Substance Use Topics   Alcohol  use: Yes   Drug use: Never     Allergies   Amoxicillin   Review of Systems Review of Systems  Constitutional:  Positive for fatigue and fever. Negative for chills.  HENT:  Positive for congestion and sore throat. Negative for ear pain.   Respiratory:  Positive for cough. Negative for shortness of breath.   Gastrointestinal:  Negative for diarrhea and vomiting.     Physical Exam Triage Vital Signs ED Triage Vitals  Encounter Vitals Group     BP 09/26/23 1354 116/72     Systolic BP Percentile --      Diastolic BP Percentile --      Pulse Rate 09/26/23 1354 73     Resp 09/26/23 1354 18     Temp 09/26/23 1354 98.9 F (37.2 C)     Temp src --      SpO2 09/26/23 1354 97 %     Weight --      Height --      Head Circumference --      Peak Flow --      Pain Score 09/26/23 1355 6     Pain Loc --      Pain Education --      Exclude from Growth Chart --    No data found.  Updated Vital Signs BP 116/72   Pulse 73   Temp 98.9 F (  37.2 C)   Resp 18   LMP 09/19/2023   SpO2 97%   Visual Acuity Right Eye Distance:   Left Eye Distance:   Bilateral Distance:    Right Eye Near:   Left Eye Near:    Bilateral Near:     Physical Exam Constitutional:      General: She is not in acute distress. HENT:     Right Ear: Tympanic membrane normal.     Left Ear: Tympanic membrane normal.     Nose: Rhinorrhea present.     Mouth/Throat:     Mouth: Mucous membranes are moist.     Pharynx: Posterior oropharyngeal erythema present.  Cardiovascular:     Rate and Rhythm: Normal rate and regular rhythm.     Heart sounds: Normal heart sounds.  Pulmonary:     Effort: Pulmonary effort is normal. No respiratory distress.     Breath sounds: Normal breath sounds.  Neurological:     Mental Status: She is alert.      UC Treatments / Results  Labs (all labs ordered are listed, but only abnormal results are displayed) Labs Reviewed  POC COVID19/FLU A&B COMBO - Normal  POCT RAPID  STREP A (OFFICE)    EKG   Radiology No results found.  Procedures Procedures (including critical care time)  Medications Ordered in UC Medications - No data to display  Initial Impression / Assessment and Plan / UC Course  I have reviewed the triage vital signs and the nursing notes.  Pertinent labs & imaging results that were available during my care of the patient were reviewed by me and considered in my medical decision making (see chart for details).    Viral URI.  Rapid strep negative.  Rapid COVID and flu negative.  Discussed symptomatic treatment including Tessalon Perles for cough, Tylenol or ibuprofen as needed for fever or discomfort, plain Mucinex as needed for congestion, rest, hydration.  Instructed patient to follow-up with PCP if not improving.  ED precautions given.  Patient agrees to plan of care.   Final Clinical Impressions(s) / UC Diagnoses   Final diagnoses:  Viral URI     Discharge Instructions      The strep, COVID and flu tests are negative.   Take the [facility] as directed for cough.  Take Tylenol or ibuprofen as needed for fever or discomfort.  Take plain Mucinex as needed for congestion.  Rest and keep yourself hydrated.    Follow-up with your primary care provider if your symptoms are not improving.         ED Prescriptions     Medication Sig Dispense Auth. Provider   benzonatate (TESSALON) 100 MG capsule Take 1 capsule (100 mg total) by mouth 3 (three) times daily as needed for cough. 21 capsule Mickie Bail, NP      PDMP not reviewed this encounter.   Mickie Bail, NP 09/26/23 1434

## 2024-01-25 ENCOUNTER — Other Ambulatory Visit: Payer: 59

## 2024-06-17 ENCOUNTER — Other Ambulatory Visit: Payer: Self-pay | Admitting: Internal Medicine

## 2024-07-27 ENCOUNTER — Encounter: Payer: Self-pay | Admitting: Internal Medicine

## 2024-07-27 ENCOUNTER — Other Ambulatory Visit (HOSPITAL_COMMUNITY)
Admission: RE | Admit: 2024-07-27 | Discharge: 2024-07-27 | Disposition: A | Discharge status: Home / Self Care | Source: Ambulatory Visit | Attending: Internal Medicine | Admitting: Internal Medicine

## 2024-07-27 ENCOUNTER — Ambulatory Visit (INDEPENDENT_AMBULATORY_CARE_PROVIDER_SITE_OTHER): Payer: 59 | Admitting: Internal Medicine

## 2024-07-27 VITALS — BP 108/64 | HR 76 | Ht 59.0 in | Wt 131.0 lb

## 2024-07-27 DIAGNOSIS — Z113 Encounter for screening for infections with a predominantly sexual mode of transmission: Secondary | ICD-10-CM

## 2024-07-27 DIAGNOSIS — Z79899 Other long term (current) drug therapy: Secondary | ICD-10-CM | POA: Diagnosis not present

## 2024-07-27 DIAGNOSIS — D649 Anemia, unspecified: Secondary | ICD-10-CM | POA: Diagnosis not present

## 2024-07-27 DIAGNOSIS — E785 Hyperlipidemia, unspecified: Secondary | ICD-10-CM

## 2024-07-27 DIAGNOSIS — Z0000 Encounter for general adult medical examination without abnormal findings: Secondary | ICD-10-CM | POA: Diagnosis not present

## 2024-07-27 DIAGNOSIS — R7401 Elevation of levels of liver transaminase levels: Secondary | ICD-10-CM | POA: Diagnosis not present

## 2024-07-27 MED ORDER — NORETHINDRONE 0.35 MG PO TABS: 1.0000 | ORAL_TABLET | Freq: Every day | ORAL | 3 refills | Status: AC

## 2024-07-27 NOTE — Patient Instructions (Addendum)
 GOOD TO SEE YOU!  I HAVE REFILLED YOUR BIRTH CONTROL FOR A YEAR.    Please schedule a return visit in 6 months to have a PAP smear done ( DON'T SCHEDULE  DURING MENSES)

## 2024-07-27 NOTE — Progress Notes (Unsigned)
 Patient ID: Chelsea Randolph, female    DOB: 2001-08-29  Age: 23 y.o. MRN: [id number]  The patient is here for annual preventive examination and management of other chronic and acute problems.   The risk factors are reflected in the social history.   The roster of all physicians providing medical care to patient - is listed in the Snapshot section of the chart.   Activities of daily living:  The patient is 100% independent in all ADLs: dressing, toileting, feeding as well as independent mobility   Home safety : The patient has smoke detectors in the home. They wear seatbelts.  There are no unsecured firearms at home. There is no violence in the home.    There is no risks for hepatitis, STDs or HIV. There is no   history of blood transfusion. They have no travel history to infectious disease endemic areas of the world.   The patient has seen their dentist in the last six month. They have seen their eye doctor in the last year. The patinet  denies slight hearing difficulty with regard to whispered voices and some television programs.  They have deferred audiologic testing in the last year.  They do not  have excessive sun exposure. Discussed the need for sun protection: hats, long sleeves and use of sunscreen if there is significant sun exposure.    Diet: the importance of a healthy diet is discussed. They do have a healthy diet.   The benefits of regular aerobic exercise were discussed. The patient  exercises  3 to 5 days per week  for  60 minutes.    Depression screen: there are no signs or vegative symptoms of depression- irritability, change in appetite, anhedonia, sadness/tearfullness.   The following portions of the patient's history were reviewed and updated as appropriate: allergies, current medications, past family history, past medical history,  past surgical history, past social history  and problem list.   Visual acuity was not assessed per patient preference since the patient has  regular follow up with an  ophthalmologist. Hearing and body mass index were assessed and reviewed.    During the course of the visit the patient was educated and counseled about appropriate screening and preventive services including : fall prevention , diabetes screening, nutrition counseling, colorectal cancer screening, and recommended immunizations.    Chief Complaint:    No current issues   Review of Symptoms  Patient denies headache, fevers, malaise, unintentional weight loss, skin rash, eye pain, sinus congestion and sinus pain, sore throat, dysphagia,  hemoptysis , cough, dyspnea, wheezing, chest pain, palpitations, orthopnea, edema, abdominal pain, nausea, melena, diarrhea, constipation, flank pain, dysuria, hematuria, urinary  Frequency, nocturia, numbness, tingling, seizures,  Focal weakness, Loss of consciousness,  Tremor, insomnia, depression, anxiety, and suicidal ideation.    Physical Exam:  BP 108/64   Pulse 76   Ht 4' 11 (1.499 m)   Wt 131 lb (59.4 kg)   SpO2 97%   BMI 26.46 kg/m    Physical Exam Vitals reviewed.  Constitutional:      General: She is not in acute distress.    Appearance: Normal appearance. She is well-developed and normal weight. She is not ill-appearing, toxic-appearing or diaphoretic.  HENT:     Head: Normocephalic.     Right Ear: Tympanic membrane, ear canal and external ear normal. There is no impacted cerumen.     Left Ear: Tympanic membrane, ear canal and external ear normal. There is no impacted cerumen.  Nose: Nose normal.     Mouth/Throat:     Mouth: Mucous membranes are moist.     Pharynx: Oropharynx is clear.  Eyes:     General: No scleral icterus.       Right eye: No discharge.        Left eye: No discharge.     Conjunctiva/sclera: Conjunctivae normal.     Pupils: Pupils are equal, round, and reactive to light.  Neck:     Thyroid : No thyromegaly.     Vascular: No carotid bruit or JVD.  Cardiovascular:     Rate and  Rhythm: Normal rate and regular rhythm.     Heart sounds: Normal heart sounds.  Pulmonary:     Effort: Pulmonary effort is normal. No respiratory distress.     Breath sounds: Normal breath sounds.  Chest:  Breasts:    Breasts are symmetrical.     Right: Normal. No swelling, inverted nipple, mass, nipple discharge, skin change or tenderness.     Left: Normal. No swelling, inverted nipple, mass, nipple discharge, skin change or tenderness.  Abdominal:     General: Bowel sounds are normal.     Palpations: Abdomen is soft. There is no mass.     Tenderness: There is no abdominal tenderness. There is no guarding or rebound.  Musculoskeletal:        General: Normal range of motion.     Cervical back: Normal range of motion and neck supple.  Lymphadenopathy:     Cervical: No cervical adenopathy.     Upper Body:     Right upper body: No supraclavicular, axillary or pectoral adenopathy.     Left upper body: No supraclavicular, axillary or pectoral adenopathy.  Skin:    General: Skin is warm and dry.  Neurological:     General: No focal deficit present.     Mental Status: She is alert and oriented to person, place, and time. Mental status is at baseline.  Psychiatric:        Mood and Affect: Mood normal.        Behavior: Behavior normal.        Thought Content: Thought content normal.        Judgment: Judgment normal.      Assessment and Plan: Encounter for preventive health examination  Long-term use of high-risk medication  Elevated ALT measurement  Anemia, unspecified type  Dyslipidemia    No follow-ups on file.  Verneita LITTIE Kettering, MD

## 2024-07-28 LAB — LIPID PANEL
Chol/HDL Ratio: 3.3 ratio (ref 0.0–4.4)
Cholesterol, Total: 219 mg/dL — ABNORMAL HIGH (ref 100–199)
HDL: 67 mg/dL (ref >39)
LDL Chol Calc (NIH): 139 mg/dL — ABNORMAL HIGH (ref 0–99)
Triglycerides: 74 mg/dL (ref 0–149)
VLDL Cholesterol Cal: 13 mg/dL (ref 5–40)

## 2024-07-28 LAB — COMPREHENSIVE METABOLIC PANEL WITH GFR
ALT: 11 IU/L (ref 0–32)
AST: 16 IU/L (ref 0–40)
Albumin: 4.9 g/dL (ref 4.0–5.0)
Alkaline Phosphatase: 41 IU/L (ref 41–116)
BUN/Creatinine Ratio: 16 (ref 9–23)
BUN: 13 mg/dL (ref 6–20)
Bilirubin Total: 0.5 mg/dL (ref 0.0–1.2)
CO2: 20 mmol/L (ref 20–29)
Calcium: 9.6 mg/dL (ref 8.7–10.2)
Chloride: 100 mmol/L (ref 96–106)
Creatinine, Ser: 0.82 mg/dL (ref 0.57–1.00)
Globulin, Total: 2.2 g/dL (ref 1.5–4.5)
Glucose: 86 mg/dL (ref 70–99)
Potassium: 4.3 mmol/L (ref 3.5–5.2)
Sodium: 137 mmol/L (ref 134–144)
Total Protein: 7.1 g/dL (ref 6.0–8.5)
eGFR: 104 mL/min/1.73 (ref >59)

## 2024-07-28 LAB — CBC WITH DIFFERENTIAL/PLATELET
Basophils Absolute: 0 x10E3/uL (ref 0.0–0.2)
Basos: 0 %
EOS (ABSOLUTE): 0.2 x10E3/uL (ref 0.0–0.4)
Eos: 4 %
Hematocrit: 42.1 % (ref 34.0–46.6)
Hemoglobin: 13.9 g/dL (ref 11.1–15.9)
Immature Grans (Abs): 0 x10E3/uL (ref 0.0–0.1)
Immature Granulocytes: 0 %
Lymphocytes Absolute: 1.7 x10E3/uL (ref 0.7–3.1)
Lymphs: 33 %
MCH: 29.1 pg (ref 26.6–33.0)
MCHC: 33 g/dL (ref 31.5–35.7)
MCV: 88 fL (ref 79–97)
Monocytes Absolute: 0.4 x10E3/uL (ref 0.1–0.9)
Monocytes: 8 %
Neutrophils Absolute: 2.8 x10E3/uL (ref 1.4–7.0)
Neutrophils: 55 %
Platelets: 268 x10E3/uL (ref 150–450)
RBC: 4.77 x10E6/uL (ref 3.77–5.28)
RDW: 12.2 % (ref 11.7–15.4)
WBC: 5.1 x10E3/uL (ref 3.4–10.8)

## 2024-07-28 LAB — LDL CHOLESTEROL, DIRECT: LDL Direct: 138 mg/dL — ABNORMAL HIGH (ref 0–99)

## 2024-07-28 LAB — TSH: TSH: 2.7 u[IU]/mL (ref 0.450–4.500)

## 2024-07-28 NOTE — Assessment & Plan Note (Signed)

## 2024-07-29 ENCOUNTER — Ambulatory Visit: Payer: Self-pay | Admitting: Internal Medicine

## 2024-07-31 ENCOUNTER — Encounter: Payer: Self-pay | Admitting: Internal Medicine

## 2024-07-31 LAB — URINE CYTOLOGY ANCILLARY ONLY
Chlamydia: NEGATIVE
Comment: NEGATIVE
Comment: NEGATIVE
Comment: NORMAL
Neisseria Gonorrhea: NEGATIVE
Trichomonas: NEGATIVE

## 2024-08-21 NOTE — Telephone Encounter (Signed)
 Are you currently out of the medication? If so when was your last dose?

## 2024-08-22 ENCOUNTER — Ambulatory Visit: Payer: Self-pay

## 2024-08-22 NOTE — Telephone Encounter (Signed)
 Pt is scheduled to see Rollene tomorrow.

## 2024-08-22 NOTE — Telephone Encounter (Signed)
 FYI Only or Action Required?: FYI only for provider: appointment scheduled on 08/23/2024.  Patient was last seen in primary care on 07/27/2024 by Marylynn Verneita CROME, MD.  Called Nurse Triage reporting Rectal Bleeding.  Symptoms began x 2 weeks.  Interventions attempted: Nothing.  Symptoms are: gradually worsening.  Triage Disposition: See Physician Within 24 Hours  Patient/caregiver understands and will follow disposition?: Yes   Copied from CRM #[id number]. Topic: Clinical - Red Word Triage >> Aug 22, 2024  9:01 AM Emylou G wrote: [facility] that prompted transfer to Nurse Triage: Blood in stool Reason for Disposition  MODERATE rectal bleeding (e.g., small blood clots, passing blood without stool, or toilet water turns red)  Answer Assessment - Initial Assessment Questions 1. APPEARANCE of BLOOD: What color is it? Is it passed separately, on the surface of the stool, or mixed in with the stool?      Bright red 2. AMOUNT: How much blood was passed?      Small amount 3. FREQUENCY: How many times has blood been passed with the stools?      With bowel movement 4. ONSET: When was the blood first seen in the stools? (Days or weeks)      2 weeks 5. DIARRHEA: Is there also some diarrhea? If Yes, ask: How many diarrhea stools in the past 24 hours?      no 6. CONSTIPATION: Do you have constipation? If Yes, ask: How bad is it?     no 7. RECURRENT SYMPTOMS: Have you had blood in your stools before? If Yes, ask: When was the last time? and What happened that time?      no 8. BLOOD THINNERS: Do you take any blood thinners? (e.g., aspirin, clopidogrel / Plavix, coumadin, heparin). Notes: Other strong blood thinners include: Arixtra (fondaparinux), Eliquis (apixaban), Pradaxa (dabigatran), and Xarelto (rivaroxaban).     no 9. OTHER SYMPTOMS: Do you have any other symptoms?  (e.g., abdomen pain, vomiting, dizziness, fever)     no 10. PREGNANCY: Is there any chance you  are pregnant? When was your last menstrual period?       no  Protocols used: Rectal Bleeding-A-AH

## 2024-08-23 ENCOUNTER — Ambulatory Visit: Admitting: Family

## 2024-08-23 ENCOUNTER — Encounter: Payer: Self-pay | Admitting: Family

## 2024-08-23 VITALS — BP 118/70 | HR 65 | Temp 98.3°F | Ht 61.0 in | Wt 135.4 lb

## 2024-08-23 DIAGNOSIS — M419 Scoliosis, unspecified: Secondary | ICD-10-CM | POA: Diagnosis not present

## 2024-08-23 DIAGNOSIS — K625 Hemorrhage of anus and rectum: Secondary | ICD-10-CM | POA: Diagnosis not present

## 2024-08-23 LAB — CBC WITH DIFFERENTIAL/PLATELET
Basophils Absolute: 0 K/uL (ref 0.0–0.1)
Basophils Relative: 0.5 % (ref 0.0–3.0)
Eosinophils Absolute: 0.1 K/uL (ref 0.0–0.7)
Eosinophils Relative: 3 % (ref 0.0–5.0)
HCT: 40.4 % (ref 36.0–46.0)
Hemoglobin: 13.6 g/dL (ref 12.0–15.0)
Lymphocytes Relative: 26.5 % (ref 12.0–46.0)
Lymphs Abs: 1.3 K/uL (ref 0.7–4.0)
MCHC: 33.8 g/dL (ref 30.0–36.0)
MCV: 86.3 fl (ref 78.0–100.0)
Monocytes Absolute: 0.4 K/uL (ref 0.1–1.0)
Monocytes Relative: 7.7 % (ref 3.0–12.0)
Neutro Abs: 3 K/uL (ref 1.4–7.7)
Neutrophils Relative %: 62.3 % (ref 43.0–77.0)
Platelets: 225 K/uL (ref 150.0–400.0)
RBC: 4.68 Mil/uL (ref 3.87–5.11)
RDW: 12.4 % (ref 11.5–15.5)
WBC: 4.9 K/uL (ref 4.0–10.5)

## 2024-08-23 NOTE — Patient Instructions (Addendum)
 25g of fiber  May bridge to get to total fiber daily with metamucil over the counter  I would recommend miralax one capful every other day to promote easier bowel movements  Referral to GI in place for further evaluation of blood  Let us  know if you dont hear back within 2 weeks in regards to an appointment being scheduled.    As discussed, please let me know if back pain were to persist certainly get worse.  For valuation of fracture you would require a CT.  Please be very careful.

## 2024-08-23 NOTE — Progress Notes (Signed)
 Assessment & Plan:  Rectal bleeding Assessment & Plan: Absence of hemorrhoid on exam.  Discussed importance of fiber and advised use of MiraLAX every other day to regulate bowels.  Placed referral to GI for further evaluation.  Orders: -     CBC with Differential/Platelet -     Ambulatory referral to Gastroenterology -     Iron, TIBC and Ferritin Panel  Scoliosis, unspecified scoliosis type, unspecified spinal region Assessment & Plan: Discussed injury yesterday and falling off her horse.  She is in no pain today.  Advised CT entirety of spine to rule out fracture.  She adamantly declines any further imaging as does not feel is necessary in the absence of pain.  Advise close monitoring let us  know if she has any pain.       Return precautions given.   Risks, benefits, and alternatives of the medications and treatment plan prescribed today were discussed, and patient expressed understanding.   Education regarding symptom management and diagnosis given to patient on AVS either electronically or printed.  No follow-ups on file.  Rollene Northern, FNP  Subjective:    Patient ID: Chelsea Randolph, female    DOB: 05/04/2001, 23 y.o.   MRN: [id number]  CC: Chelsea Randolph is a 23 y.o. female who presents today for an acute visit.    HPI: Complains of BRB with wiping x 2 weeks after a BM only, intermittent She states 'not that much blood' . Describes 'ripping feeling' when has a BM. No blood in stool. Stool is not narrowed Denies constipation, straining to have BM, diarrhea, fever, chills, abnormal vaginal discharge, rectal itching.     Menses LMP 2 weeks ago  She fell off a horse yesterday and landed on her lower back.  She wanted to mention today however she has not any pain.  Denies difficulty walking.   No head injury, loc.  She was not wearing a helmet  No bruising. Walking without pain.      H/o scoliosis , s/p surgery has rod  07/27/24  TSH 2.7 Hbg  13.9  Allergies: Amoxicillin Medications Ordered Prior to Encounter[1]  Review of Systems  Constitutional:  Negative for chills and fever.  Respiratory:  Negative for cough.   Cardiovascular:  Negative for chest pain and palpitations.  Gastrointestinal:  Positive for anal bleeding. Negative for blood in stool, constipation, diarrhea, nausea and vomiting.  Genitourinary:  Negative for dysuria.  Musculoskeletal:  Positive for back pain.  Neurological:  Negative for numbness.      Objective:    BP 118/70   Pulse 65   Temp 98.3 F (36.8 C) (Oral)   Ht 5' 1 (1.549 m)   Wt 135 lb 6.4 oz (61.4 kg)   LMP  (LMP Unknown)   SpO2 97%   BMI 25.58 kg/m   BP Readings from Last 3 Encounters:  08/23/24 118/70  07/27/24 108/64  09/26/23 116/72   Wt Readings from Last 3 Encounters:  08/23/24 135 lb 6.4 oz (61.4 kg)  07/27/24 131 lb (59.4 kg)  07/25/23 135 lb (61.2 kg)    Physical Exam Vitals reviewed.  Constitutional:      Appearance: She is well-developed.  Eyes:     Conjunctiva/sclera: Conjunctivae normal.  Cardiovascular:     Rate and Rhythm: Normal rate and regular rhythm.     Pulses: Normal pulses.     Heart sounds: Normal heart sounds.  Pulmonary:     Effort: Pulmonary effort is normal.  Breath sounds: Normal breath sounds. No wheezing, rhonchi or rales.  Genitourinary:    Rectum: No anal fissure or external hemorrhoid.     Comments: No rectal bleeding.  No hemorrhoid on digital exam Musculoskeletal:       Arms:     Comments: Linear surgical incision scar present throughout entire spine.  No bony step-off, no pain with palpation, hematoma, ecchymosis present over spine.   Skin:    General: Skin is warm and dry.  Neurological:     Mental Status: She is alert.  Psychiatric:        Speech: Speech normal.        Behavior: Behavior normal.        Thought Content: Thought content normal.           [1]  Current Outpatient Medications on File Prior to Visit   Medication Sig Dispense Refill   norethindrone  (INCASSIA ) 0.35 MG tablet Take 1 tablet (0.35 mg total) by mouth daily. 84 tablet 3   No current facility-administered medications on file prior to visit.

## 2024-08-23 NOTE — Assessment & Plan Note (Signed)
 Absence of hemorrhoid on exam.  Discussed importance of fiber and advised use of MiraLAX every other day to regulate bowels.  Placed referral to GI for further evaluation.

## 2024-08-23 NOTE — Assessment & Plan Note (Signed)
 Discussed injury yesterday and falling off her horse.  She is in no pain today.  Advised CT entirety of spine to rule out fracture.  She adamantly declines any further imaging as does not feel is necessary in the absence of pain.  Advise close monitoring let us  know if she has any pain.

## 2024-08-24 ENCOUNTER — Ambulatory Visit: Payer: Self-pay | Admitting: Family

## 2024-08-24 LAB — IRON,TIBC AND FERRITIN PANEL
%SAT: 13 % — ABNORMAL LOW (ref 16–45)
Ferritin: 24 ng/mL (ref 16–154)
Iron: 44 ug/dL (ref 40–190)
TIBC: 328 ug/dL (ref 250–450)

## 2025-01-18 ENCOUNTER — Ambulatory Visit: Admitting: Internal Medicine
